# Patient Record
Sex: Male | Born: 1980 | Race: Black or African American | Hispanic: No | Marital: Married | Smoking: Never smoker
Health system: Southern US, Community
[De-identification: ages and names within clinical notes are randomized; demographics above are authoritative.]

## PROBLEM LIST (undated history)

## (undated) DIAGNOSIS — R739 Hyperglycemia, unspecified: Secondary | ICD-10-CM

## (undated) HISTORY — DX: Hyperglycemia, unspecified: R73.9

---

## 2016-10-30 ENCOUNTER — Encounter (HOSPITAL_COMMUNITY): Payer: Self-pay

## 2016-10-30 ENCOUNTER — Emergency Department (HOSPITAL_COMMUNITY)
Admission: EM | Admit: 2016-10-30 | Discharge: 2016-10-30 | Disposition: A | Discharge status: Home / Self Care | Payer: Self-pay | Attending: Emergency Medicine | Admitting: Emergency Medicine

## 2016-10-30 DIAGNOSIS — R69 Illness, unspecified: Secondary | ICD-10-CM

## 2016-10-30 DIAGNOSIS — J111 Influenza due to unidentified influenza virus with other respiratory manifestations: Secondary | ICD-10-CM | POA: Insufficient documentation

## 2016-10-30 MED ORDER — BENZONATATE 200 MG PO CAPS: 200.0000 mg | ORAL_CAPSULE | Freq: Three times a day (TID) | ORAL | 0 refills | Status: AC | PRN

## 2016-10-30 NOTE — ED Provider Notes (Signed)
[facility]EMER[facility]Y DEPT Provider Note   CSN: [id number] Arrival date & time: 10/30/16  1820  By signing my name below, I, Gregory Guerra, attest that this documentation has been prepared under the direction and in the presence of Gregory Buffalo, NP. Electronically Signed: Talbert Guerra, Scribe. 10/30/16. 6:45 PM.   History   Chief Complaint Chief Complaint  Patient presents with  . Generalized Body Aches    HPI Gregory Guerra is a 36 y.o. male who presents to the Emergency Department complaining of gradual onset, moderate, persistent generalized body aches that started 4 days ago. Pt has associated cough producing green/yellow phelgm, fever TMAX 103, sore throat, head ache, abdominal pain, ear pain. Pt has takes ibuprofen with limited relief. Pt report negative sick contact. Pt denies nausea, vomiting. Pt is able to keep fluids gone and appetite has not decreased.    The history is provided by the patient. No language interpreter was used.    History reviewed. No pertinent past medical history.  There are no active problems to display for this patient.   History reviewed. No pertinent surgical history.     Home Medications    Prior to Admission medications   Medication Sig Start Date End Date Taking? Authorizing Provider  benzonatate (TESSALON) 200 MG capsule Take 1 capsule (200 mg total) by mouth 3 (three) times daily as needed for cough. 10/30/16   Gregory Orlene Och, NP    Family History No family history on file.  Social History Social History  Substance Use Topics  . Smoking status: Never Smoker  . Smokeless tobacco: Never Used  . Alcohol use Not on file     Allergies   Patient has no known allergies.   Review of Systems Review of Systems  Constitutional: Positive for fever.  HENT: Positive for congestion, ear pain and sore throat.   Respiratory: Positive for cough.   Gastrointestinal: Positive for abdominal pain. Negative for nausea and vomiting.  Musculoskeletal: Positive  for arthralgias and myalgias.  Neurological: Negative for light-headedness.     Physical Exam Updated Vital Signs BP 124/73 (BP Location: Left Arm)   Pulse 78   Temp 99.6 F (37.6 C) (Oral)   Resp 20   Ht 5\' 6"  (1.676 m)   Wt 122.5 kg   SpO2 97%   BMI 43.58 kg/m   Physical Exam  Constitutional: He is oriented to person, place, and time. He appears well-developed and well-nourished. No distress.  HENT:  Head: Normocephalic and atraumatic.  Nose: Rhinorrhea present.  Mouth/Throat: Uvula is midline and mucous membranes are normal. No posterior oropharyngeal edema or posterior oropharyngeal erythema.  Left TM is normal. Right Tm is dull, retracted. No TM perforation, no erythema.  Eyes: Conjunctivae and EOM are normal. Pupils are equal, round, and reactive to light.  Pt wears contacts. Sclera is slightly red.  Neck: Normal range of motion. Neck supple. No tracheal deviation present.  No meningeal signs  Cardiovascular: Normal rate and regular rhythm.   Pulmonary/Chest: Effort normal. No respiratory distress. He has no wheezes. He has no rales. He exhibits no tenderness.  Abdominal: Soft. Bowel sounds are normal. There is no tenderness.  Musculoskeletal: Normal range of motion.  Lymphadenopathy:    He has no cervical adenopathy.  Neurological: He is alert and oriented to person, place, and time.  Skin: Skin is warm and dry.  Psychiatric: He has a normal mood and affect.  Nursing note and vitals reviewed.    ED Treatments / Results   DIAGNOSTIC  STUDIES: Oxygen Saturation is 99% on room air, normal by my interpretation.    COORDINATION OF CARE: 6:43 PM Discussed treatment plan with pt at bedside and pt agreed to plan, which includes not being able to start Tamaflu, but continual use ibuprofen, use of a humidifier, bed rest. If he gets worse or starts to decline, he has been told to come back.  Radiology No results found.  Procedures Procedures (including critical care  time)  Medications Ordered in ED Medications - No data to display   Initial Impression / Assessment and Plan / ED Course  I have reviewed the triage vital signs and the nursing notes.   Final Clinical Impressions(s) / ED Diagnoses  SUBJECTIVE:  Gregory Guerra is a 36 y.o. male who present complaining of flu-like symptoms: fevers, chills, myalgias, congestion, sore throat and cough for 4 days. Denies dyspnea or wheezing.  OBJECTIVE: Appears moderately ill but not toxic; temperature as noted in vitals. Throat and pharynx normal.  Neck supple. No adenopathy in the neck. Sinuses non tender. The chest is clear.  ASSESSMENT: Influenza like symptoms  PLAN: Symptomatic therapy suggested: rest, increase fluids, gargle prn for sore throat, use mist of vaporizer prn. Tylenol and ibuprofen for body aches and fever, cough medication as directed and return prn if symptoms persist or worsen.  Final diagnoses:  Influenza-like illness    New Prescriptions Discharge Medication List as of 10/30/2016  6:49 PM    START taking these medications   Details  benzonatate (TESSALON) 200 MG capsule Take 1 capsule (200 mg total) by mouth 3 (three) times daily as needed for cough., Starting Sat 10/30/2016, Print       I personally performed the services described in this documentation, which was scribed in my presence. The recorded information has been reviewed and is accurate.     Gregory M Neese, NP 10/30/16 1924    Arby BarretteMarcy Pfeiffer, MD 11/03/16 [phone]

## 2016-10-30 NOTE — ED Triage Notes (Signed)
PT reports he did not take the flu shot this year

## 2016-10-30 NOTE — ED Notes (Signed)
Declined W/C at D/C and was escorted to lobby by RN. 

## 2016-10-30 NOTE — ED Triage Notes (Signed)
Patient complains of cough, congestion, chills and body aches x 4 days, NAD. Alert and oriented

## 2016-10-30 NOTE — Discharge Instructions (Signed)
Take the cough pills as directed, take tylenol and ibuprofen as needed for fever and body aches. Use Chloraseptic spray and lozenges for your sore throat. Return if symptoms worsen.

## 2017-03-09 ENCOUNTER — Emergency Department (HOSPITAL_COMMUNITY)
Admission: EM | Admit: 2017-03-09 | Discharge: 2017-03-09 | Disposition: A | Discharge status: Home / Self Care | Payer: No Typology Code available for payment source | Attending: Emergency Medicine | Admitting: Emergency Medicine

## 2017-03-09 ENCOUNTER — Emergency Department (HOSPITAL_COMMUNITY): Payer: No Typology Code available for payment source

## 2017-03-09 DIAGNOSIS — S161XXA Strain of muscle, fascia and tendon at neck level, initial encounter: Secondary | ICD-10-CM | POA: Insufficient documentation

## 2017-03-09 DIAGNOSIS — Y92410 Unspecified street and highway as the place of occurrence of the external cause: Secondary | ICD-10-CM | POA: Diagnosis not present

## 2017-03-09 DIAGNOSIS — S0003XA Contusion of scalp, initial encounter: Secondary | ICD-10-CM | POA: Diagnosis not present

## 2017-03-09 DIAGNOSIS — Y93I9 Activity, other involving external motion: Secondary | ICD-10-CM | POA: Insufficient documentation

## 2017-03-09 DIAGNOSIS — S0990XA Unspecified injury of head, initial encounter: Secondary | ICD-10-CM | POA: Diagnosis present

## 2017-03-09 DIAGNOSIS — Y999 Unspecified external cause status: Secondary | ICD-10-CM | POA: Insufficient documentation

## 2017-03-09 MED ORDER — HYDROCODONE-ACETAMINOPHEN 5-325 MG PO TABS
1.0000 | ORAL_TABLET | Freq: Once | ORAL | Status: AC
  Administered 2017-03-09: 1 via ORAL
  Filled 2017-03-09: qty 1

## 2017-03-09 MED ORDER — DICLOFENAC SODIUM 50 MG PO TBEC: 50.0000 mg | DELAYED_RELEASE_TABLET | Freq: Two times a day (BID) | ORAL | 0 refills | Status: AC

## 2017-03-09 MED ORDER — CYCLOBENZAPRINE HCL 10 MG PO TABS
10.0000 mg | ORAL_TABLET | Freq: Once | ORAL | Status: AC
  Administered 2017-03-09: 10 mg via ORAL
  Filled 2017-03-09: qty 1

## 2017-03-09 MED ORDER — IBUPROFEN 200 MG PO TABS
600.0000 mg | ORAL_TABLET | Freq: Once | ORAL | Status: AC | PRN
  Administered 2017-03-09: 600 mg via ORAL

## 2017-03-09 MED ORDER — CYCLOBENZAPRINE HCL 10 MG PO TABS: 10.0000 mg | ORAL_TABLET | Freq: Two times a day (BID) | ORAL | 0 refills | Status: AC | PRN
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
## 2017-03-09 NOTE — ED Notes (Signed)
Pt is ambulatory, no acute distress.
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
## 2019-08-23 ENCOUNTER — Emergency Department (HOSPITAL_COMMUNITY): Payer: BC Managed Care – PPO

## 2019-08-23 ENCOUNTER — Other Ambulatory Visit: Payer: Self-pay

## 2019-08-23 ENCOUNTER — Emergency Department (HOSPITAL_COMMUNITY)
Admission: EM | Admit: 2019-08-23 | Discharge: 2019-08-23 | Disposition: A | Discharge status: Home / Self Care | Payer: BC Managed Care – PPO | Attending: Emergency Medicine | Admitting: Emergency Medicine

## 2019-08-23 DIAGNOSIS — R0602 Shortness of breath: Secondary | ICD-10-CM | POA: Diagnosis not present

## 2019-08-23 DIAGNOSIS — Z20828 Contact with and (suspected) exposure to other viral communicable diseases: Secondary | ICD-10-CM | POA: Insufficient documentation

## 2019-08-23 DIAGNOSIS — R072 Precordial pain: Secondary | ICD-10-CM | POA: Diagnosis not present

## 2019-08-23 DIAGNOSIS — L729 Follicular cyst of the skin and subcutaneous tissue, unspecified: Secondary | ICD-10-CM | POA: Diagnosis not present

## 2019-08-23 DIAGNOSIS — R0789 Other chest pain: Secondary | ICD-10-CM | POA: Diagnosis present

## 2019-08-23 LAB — BASIC METABOLIC PANEL WITH GFR
Anion gap: 11 (ref 5–15)
BUN: 12 mg/dL (ref 6–20)
CO2: 27 mmol/L (ref 22–32)
Calcium: 9.8 mg/dL (ref 8.9–10.3)
Chloride: 101 mmol/L (ref 98–111)
Creatinine, Ser: 1.08 mg/dL (ref 0.61–1.24)
GFR calc Af Amer: >60 mL/min
GFR calc non Af Amer: >60 mL/min
Glucose, Bld: 96 mg/dL (ref 70–99)
Potassium: 4.2 mmol/L (ref 3.5–5.1)
Sodium: 139 mmol/L (ref 135–145)

## 2019-08-23 LAB — CBC
HCT: 44.7 % (ref 39.0–52.0)
Hemoglobin: 14.2 g/dL (ref 13.0–17.0)
MCH: 26.7 pg (ref 26.0–34.0)
MCHC: 31.8 g/dL (ref 30.0–36.0)
MCV: 84 fL (ref 80.0–100.0)
Platelets: 426 K/uL — ABNORMAL HIGH (ref 150–400)
RBC: 5.32 MIL/uL (ref 4.22–5.81)
RDW: 14.5 % (ref 11.5–15.5)
WBC: 9.1 K/uL (ref 4.0–10.5)
nRBC: 0 % (ref 0.0–0.2)

## 2019-08-23 LAB — LIPASE, BLOOD: Lipase: 24 U/L (ref 11–51)

## 2019-08-23 LAB — HEPATIC FUNCTION PANEL
ALT: 24 U/L (ref 0–44)
AST: 24 U/L (ref 15–41)
Albumin: 4.3 g/dL (ref 3.5–5.0)
Alkaline Phosphatase: 69 U/L (ref 38–126)
Bilirubin, Direct: <0.1 mg/dL (ref 0.0–0.2)
Total Bilirubin: 0.8 mg/dL (ref 0.3–1.2)
Total Protein: 7.7 g/dL (ref 6.5–8.1)

## 2019-08-23 LAB — D-DIMER, QUANTITATIVE (NOT AT ARMC): D-Dimer, Quant: 0.36 ug/mL-FEU (ref 0.00–0.50)

## 2019-08-23 LAB — POC SARS CORONAVIRUS 2 AG -  ED: SARS Coronavirus 2 Ag: NEGATIVE

## 2019-08-23 LAB — TROPONIN I (HIGH SENSITIVITY): Troponin I (High Sensitivity): 5 ng/L (ref <18)
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
## 2019-08-23 NOTE — ED Notes (Signed)
"  Patient verbalizes understanding of discharge instructions. Opportunity for questioning and answers were provided.  pt discharged from ED ."  

## 2019-08-23 NOTE — ED Provider Notes (Signed)
[facility] EMERGENCY DEPARTMENT Provider Note   CSN: [id number] Arrival date & time: 08/23/19  1656     History Chief Complaint  Patient presents with  . Chest Pain    Mansel Strother is a 38 y.o. male.  The history is provided by the patient and medical records. No language interpreter was used.  Chest Pain Pain location:  Substernal area Pain quality: pressure   Pain radiates to:  Does not radiate Pain severity:  Moderate Onset quality:  Sudden Timing:  Constant Progression:  Improving Chronicity:  New Relieved by:  Nothing Worsened by:  Deep breathing Ineffective treatments:  None tried Associated symptoms: shortness of breath   Associated symptoms: no abdominal pain, no back pain, no cough, no diaphoresis, no dizziness, no fatigue, no fever, no headache, no lower extremity edema, no nausea, no palpitations, no vomiting and no weakness   Risk factors: male sex   Risk factors: no coronary artery disease        No past medical history on file.  There are no problems to display for this patient.   No past surgical history on file.     No family history on file.  Social History   Tobacco Use  . Smoking status: Never Smoker  . Smokeless tobacco: Never Used  Substance Use Topics  . Alcohol use: Not on file  . Drug use: Not on file    Home Medications Prior to Admission medications   Medication Sig Start Date End Date Taking? Authorizing Provider  benzonatate (TESSALON) 200 MG capsule Take 1 capsule (200 mg total) by mouth 3 (three) times daily as needed for cough. 10/30/16   Janne Napoleon, NP  cyclobenzaprine (FLEXERIL) 10 MG tablet Take 1 tablet (10 mg total) by mouth 2 (two) times daily as needed for muscle spasms. 03/09/17   Janne Napoleon, NP  diclofenac (VOLTAREN) 50 MG EC tablet Take 1 tablet (50 mg total) by mouth 2 (two) times daily. 03/09/17   Janne Napoleon, NP    Allergies    Patient has no known allergies.  Review of Systems     Review of Systems  Constitutional: Negative for chills, diaphoresis, fatigue and fever.  HENT: Negative for congestion.   Respiratory: Positive for shortness of breath. Negative for cough, chest tightness, wheezing and stridor.   Cardiovascular: Positive for chest pain. Negative for palpitations.  Gastrointestinal: Negative for abdominal pain, constipation, diarrhea, nausea and vomiting.  Genitourinary: Negative for flank pain.  Musculoskeletal: Negative for back pain, neck pain and neck stiffness.  Skin: Negative for rash and wound.  Neurological: Negative for dizziness, weakness, light-headedness and headaches.  Psychiatric/Behavioral: Negative for agitation and confusion.  All other systems reviewed and are negative.   Physical Exam Updated Vital Signs BP 131/87 (BP Location: Right Arm)   Pulse 76   Temp 98 F (36.7 C) (Oral)   Resp 16   SpO2 98%   Physical Exam Vitals and nursing note reviewed.  Constitutional:      General: He is not in acute distress.    Appearance: He is well-developed. He is not ill-appearing, toxic-appearing or diaphoretic.  HENT:     Head: Normocephalic and atraumatic.  Eyes:     Extraocular Movements: Extraocular movements intact.     Conjunctiva/sclera: Conjunctivae normal.     Pupils: Pupils are equal, round, and reactive to light.  Cardiovascular:     Rate and Rhythm: Normal rate and regular rhythm.     Heart sounds:  Normal heart sounds. No murmur.  Pulmonary:     Effort: Pulmonary effort is normal. No respiratory distress.     Breath sounds: Normal breath sounds. No decreased breath sounds, wheezing, rhonchi or rales.  Abdominal:     Palpations: Abdomen is soft.     Tenderness: There is no abdominal tenderness.  Musculoskeletal:     Cervical back: Neck supple.     Right lower leg: No tenderness. No edema.     Left lower leg: No tenderness. No edema.  Skin:    General: Skin is warm and dry.     Capillary Refill: Capillary refill takes  less than 2 seconds.     Findings: No erythema.  Neurological:     General: No focal deficit present.     Mental Status: He is alert.     ED Results / Procedures / Treatments   Labs (all labs ordered are listed, but only abnormal results are displayed) Labs Reviewed  CBC - Abnormal; Notable for the following components:      Result Value   Platelets 426 (*)    All other components within normal limits  NOVEL CORONAVIRUS, NAA (HOSP ORDER, SEND-OUT TO REF LAB; TAT 18-24 HRS)  BASIC METABOLIC PANEL  HEPATIC FUNCTION PANEL  LIPASE, BLOOD  D-DIMER, QUANTITATIVE (NOT [facility])  POC SARS CORONAVIRUS 2 AG -  ED  TROPONIN I (HIGH SENSITIVITY)  TROPONIN I (HIGH SENSITIVITY)    EKG EKG Interpretation  Date/Time:  Thursday August 23 2019 16:59:55 EST Ventricular Rate:  73 PR Interval:  132 QRS Duration: 90 QT Interval:  364 QTC Calculation: 401 R Axis:   76 Text Interpretation: Normal sinus rhythm Possible Inferior infarct , age undetermined Abnormal ECG S1Q3T3. No prior ECG for comparison. No STEMI Confirmed by Theda Belfastegeler, Chris ([id number]) on 08/23/2019 6:12:37 PM   Radiology DG Chest 2 View  Result Date: 08/23/2019 CLINICAL DATA:  Chest tightness and pressure. EXAM: CHEST - 2 VIEW COMPARISON:  None. FINDINGS: The heart size and mediastinal contours are within normal limits. Both lungs are clear. The visualized skeletal structures are unremarkable. IMPRESSION: No active cardiopulmonary disease. Electronically Signed   By: Sherian ReinWei-Chen  Lin M.D.   On: 08/23/2019 17:59   US SCROTUM W/DOPPLER  Result Date: 08/23/2019 CLINICAL DATA:  Left scrotal pain EXAM: SCROTAL ULTRASOUND DOPPLER ULTRASOUND OF THE TESTICLES TECHNIQUE: Complete ultrasound examination of the testicles, epididymis, and other scrotal structures was performed. Color and spectral Doppler ultrasound were also utilized to evaluate blood flow to the testicles. COMPARISON:  None. FINDINGS: Right testicle Measurements: 3.8 x 2.2 x  3.7 cm. No mass or microlithiasis visualized. Left testicle Measurements: 3.4 x 2.1 x 3.2 cm. There is an anechoic thin walled cystic structure not clearly in continuity with either the testicle or the epididymis. Appears closely associated with the scrotal wall, measuring 2.4 x 4.1 x 2.1 cm Right epididymis:  Normal in size and appearance. Left epididymis:  Normal in size and appearance. Hydrocele:  None visualized. Varicocele:  None visualized. Pulsed Doppler interrogation of both testes demonstrates normal low resistance arterial and venous waveforms bilaterally. IMPRESSION: Area of discomfort likely corresponds to a simple appearing cystic paratesticular cystic lesion without clear connection to the epididymis. Overall, appearance favors a benign etiology such as a scrotal tunica cyst without aggressive soft tissue features. Electronically Signed   By: Kreg ShropshirePrice  DeHay M.D.   On: 08/23/2019 21:12    Procedures Procedures (including critical care time)  Medications Ordered in ED Medications - No data  to display  ED Course  I have reviewed the triage vital signs and the nursing notes.  Pertinent labs & imaging results that were available during my care of the patient were reviewed by me and considered in my medical decision making (see chart for details).    MDM Rules/Calculators/A&P                        Franco Duley is a 38 y.o. male   with no significant past medical history who presents with chest pain shortness of breath since yesterday.  He reports that yesterday while driving his car, he had sudden onset of chest pain shortness of breath.  It was like a pressure and tightness going around his chest from the front.  He reports it is worse with deep breathing.  He denies recent cough or congestion.  No recent fevers or chills.  He denies any nausea vomiting, urinary symptoms or GI symptoms.  No diaphoresis.  It does not radiate down his arms.  He denies any leg pain or leg swelling.  He does  report chronic left scrotal aching and discomfort but denies any swelling.  No urinary symptoms.  No reported hematuria.  No other complaints on arrival.    On exam.  Lungs are clear and chest is nontender.  Abdomen is nontender.  Good pulses in upper and lower extremities.  No lower extremity tenderness appreciated.  Abdomen is nontender.  Patient resting comfortably on room air.  EKG shows no STEMI but does show a S1Q3T3 pattern.  Will get D-dimer to help rule out PE as etiology of chest discomfort and shortness of breath.  Patient will have labs and chest x-ray.  Anticipate reassessment for work-up.  We will assess the patient's scrotum with a chaperone to determine if we feel he needs ultrasound.    8:00 PM Chaperone was present for a scrotal exam.  Patient does not have evidence of a hernia but did have some tenderness on the left hemiscrotum.  No true testicle tenderness.  Given the tenderness, will get ultrasound to rule out abnormality.  Ultrasound of the scrotum did not show torsion or hernia.  Patient did have a small cyst which he can follow-up with urology for.  Patient's chest pain and shortness of breath improved in the emergency department and his labs were overall reassuring.  His D-dimer was negative, doubt PE.  Initial troponin negative.  We discussed a second troponin however given his pain being ongoing since yesterday, he agreed for single troponin.  Patient otherwise felt better and his labs were reassuring.  Rapid Covid was negative but will get PCR sent.  Patient will be discharged to follow with PCP and will follow up on the Covid result.  Patient discharged in good condition with improved symptoms.   Final Clinical Impression(s) / ED Diagnoses Final diagnoses:  Precordial pain  Shortness of breath  Scrotal cyst    Rx / DC Orders ED Discharge Orders    None      Clinical Impression: 1. Precordial pain   2. Shortness of breath   3. Scrotal cyst      Disposition: Discharge  Condition: Good  I have discussed the results, Dx and Tx plan with the pt(& family if present). He/she/they expressed understanding and agree(s) with the plan. Discharge instructions discussed at great length. Strict return precautions discussed and pt &/or family have verbalized understanding of the instructions. No further questions at time of discharge.  New Prescriptions   No medications on file    Follow Up: [facility] [address] [place] [address] [phone]    [facility] EMERGENCY DEPARTMENT [address] [id number] mc [place] [address] [phone]    [facility] [address] [place] [address] [phone] Schedule an appointment as soon as possible for a visit       Daxton Nydam, Canary Brim, MD 08/23/19 [phone]

## 2019-08-23 NOTE — Discharge Instructions (Signed)
Your work-up today was overall reassuring.  Your cardiac enzyme was negative on a single test and as we discussed together, based on your symptoms being present since yesterday, we agreed to not get a second at this time.  Your test to rule out blood clot was negative.  Your chest x-ray was reassuring.  Your scrotal ultrasound did not show torsion or infection but did show a small cyst.  Please follow-up with urology for further management of this.  Please rest and stay hydrated.  Your second Covid test should result tomorrow, please follow on my chart for this result.  You will also be called if it is positive.  Please rest.  If any symptoms change or worsen, please return to nearest emergency department.

## 2019-08-25 LAB — NOVEL CORONAVIRUS, NAA (HOSP ORDER, SEND-OUT TO REF LAB; TAT 18-24 HRS): SARS-CoV-2, NAA: NOT DETECTED

## 2019-08-30 ENCOUNTER — Ambulatory Visit: Payer: BC Managed Care – PPO | Admitting: Cardiovascular Disease

## 2019-08-30 NOTE — Progress Notes (Deleted)
Cardiology Office Note   Date:  08/30/2019   ID:  Gregory Guerra, DOB July 05, 1981, MRN [id number]  PCP:  Patient, No Pcp Per  Cardiologist:   Chilton Si, MD   No chief complaint on file.     History of Present Illness: Gregory Guerra is a 38 y.o. male who presents for an evaluation of chest pain and shortness of breath.    He was seen in the ED where EKG was concerning for PE.  He had a negative d-dimer and cardiac enzymes were negative.  He was instructed to follow up as an outpatient.  COVID was negative.   No past medical history on file.  No past surgical history on file.   Current Outpatient Medications  Medication Sig Dispense Refill  . benzonatate (TESSALON) 200 MG capsule Take 1 capsule (200 mg total) by mouth 3 (three) times daily as needed for cough. (Patient not taking: Reported on 08/23/2019) 20 capsule 0  . cyclobenzaprine (FLEXERIL) 10 MG tablet Take 1 tablet (10 mg total) by mouth 2 (two) times daily as needed for muscle spasms. (Patient not taking: Reported on 08/23/2019) 20 tablet 0  . diclofenac (VOLTAREN) 50 MG EC tablet Take 1 tablet (50 mg total) by mouth 2 (two) times daily. (Patient not taking: Reported on 08/23/2019) 15 tablet 0   No current facility-administered medications for this visit.    Allergies:   Apple and Cherry    Social History:  The patient  reports that he has never smoked. He has never used smokeless tobacco.   Family History:  The patient's ***family history is not on file.    ROS:  Please see the history of present illness.   Otherwise, review of systems are positive for {NONE DEFAULTED:18576::"none"}.   All other systems are reviewed and negative.    PHYSICAL EXAM: VS:  There were no vitals taken for this visit. , BMI There is no height or weight on file to calculate BMI. GENERAL:  Well appearing HEENT:  Pupils equal round and reactive, fundi not visualized, oral mucosa unremarkable NECK:  No jugular venous distention,  waveform within normal limits, carotid upstroke brisk and symmetric, no bruits, no thyromegaly LYMPHATICS:  No cervical adenopathy LUNGS:  Clear to auscultation bilaterally HEART:  RRR.  PMI not displaced or sustained,S1 and S2 within normal limits, no S3, no S4, no clicks, no rubs, *** murmurs ABD:  Flat, positive bowel sounds normal in frequency in pitch, no bruits, no rebound, no guarding, no midline pulsatile mass, no hepatomegaly, no splenomegaly EXT:  2 plus pulses throughout, no edema, no cyanosis no clubbing SKIN:  No rashes no nodules NEURO:  Cranial nerves II through XII grossly intact, motor grossly intact throughout PSYCH:  Cognitively intact, oriented to person place and time    EKG:  EKG {ACTION; IS/IS WUJ:81191478} ordered today. The ekg ordered today demonstrates ***   Recent Labs: 08/23/2019: ALT 24; BUN 12; Creatinine, Ser 1.08; Hemoglobin 14.2; Platelets 426; Potassium 4.2; Sodium 139    Lipid Panel No results found for: CHOL, TRIG, HDL, CHOLHDL, VLDL, LDLCALC, LDLDIRECT    Wt Readings from Last 3 Encounters:  10/30/16 270 lb (122.5 kg)      ASSESSMENT AND PLAN:  ***   Current medicines are reviewed at length with the patient today.  The patient {ACTIONS; HAS/DOES NOT HAVE:19233} concerns regarding medicines.  The following changes have been made:  {PLAN; NO CHANGE:13088:s}  Labs/ tests ordered today include: *** No orders of the defined  types were placed in this encounter.    Disposition:   FU with ***     Signed, Parthena Fergeson C. Oval Linsey, MD, [facility]  08/30/2019 1:19 PM    [place]

## 2019-11-06 ENCOUNTER — Other Ambulatory Visit: Payer: Self-pay

## 2019-11-06 ENCOUNTER — Encounter: Payer: Self-pay | Admitting: Cardiology

## 2019-11-06 ENCOUNTER — Ambulatory Visit: Payer: BC Managed Care – PPO | Admitting: Cardiology

## 2019-11-06 VITALS — BP 128/74 | HR 80 | Temp 96.7°F | Ht 66.0 in | Wt 304.0 lb

## 2019-11-06 DIAGNOSIS — R079 Chest pain, unspecified: Secondary | ICD-10-CM

## 2019-11-06 NOTE — Patient Instructions (Signed)
Medication Instructions:  No Changes *If you need a refill on your cardiac medications before your next appointment, please call your pharmacy*  Lab Work: None If you have labs (blood work) drawn today and your tests are completely normal, you will receive your results only by: Marland Kitchen MyChart Message (if you have MyChart) OR . A paper copy in the mail If you have any lab test that is abnormal or we need to change your treatment, we will call you to review the results.  Testing/Procedures: Your physician has requested that you have an exercise tolerance test. For further information please visit [email or web address] Please also follow instruction sheet, as given.   You will need a Covid Screening 3 days prior to your exercise tolerance test.This is a Drive Up Visit at the [facility] [address] [place]. Someone will direct you to the appropriate testing line. Stay in your car and someone will be with you shortly   Follow-Up: At [facility], you and your health needs are our priority.  As part of our continuing mission to provide you with exceptional heart care, we have created designated Provider Care Teams.  These Care Teams include your primary Cardiologist (physician) and Advanced Practice Providers (APPs -  Physician Assistants and Nurse Practitioners) who all work together to provide you with the care you need, when you need it.  Your next appointment:   Follow up as needed

## 2019-11-06 NOTE — Progress Notes (Signed)
Cardiology Office Note   Date:  11/06/2019   ID:  Gregory Guerra, DOB 27-Dec-1980, MRN [id number]  PCP:  Patient, No Pcp Per  Cardiologist:   No primary care provider on file. Referring:  Delma Officer, PA   Chief Complaint  Patient presents with  . Chest Pain      History of Present Illness: Gregory Guerra is a 39 y.o. male who is referred by Delma Officer, PA for evaluation of chest pain.    He was in the ED in December for chest pain.  I reviewed these records for this visit.    This pain was not thought to be cardiac.  EKG and troponin was negative.    He reports that in December he started noticing some discomfort when he would wake up in the morning. It would last for about 10 minutes. It was 4 out of 10 in intensity and would happen when he got up out of bed quickly. The episode in December when he presented to the emergency room was while he was driving. It was severe and heavy and lasted about 20 minutes. He thinks it was 9 out of 10 in intensity. He came in once spontaneously. There were no associated symptoms. The most recent episode of any discomfort was 2 days ago when he had two shots of liquor. He had some chest tightness. He was some mild associated shortness of breath. He has had some discomfort during intercourse. He has some mild discomfort perhaps jogging a couple of weeks ago. He has some mild dyspnea on exertion.  He has never had prior cardiac history however. He has just been told that his hemoglobin A1c was elevated but he otherwise has had no hypertension dyslipidemia diabetes or other cardiovascular risk factors.  Past Medical History:  Diagnosis Date  . Hyperglycemia     History reviewed. No pertinent surgical history.   Current Outpatient Medications  Medication Sig Dispense Refill  . benzonatate (TESSALON) 200 MG capsule Take 1 capsule (200 mg total) by mouth 3 (three) times daily as needed for cough. (Patient not taking: Reported on 11/06/2019) 20  capsule 0  . cyclobenzaprine (FLEXERIL) 10 MG tablet Take 1 tablet (10 mg total) by mouth 2 (two) times daily as needed for muscle spasms. (Patient not taking: Reported on 11/06/2019) 20 tablet 0  . diclofenac (VOLTAREN) 50 MG EC tablet Take 1 tablet (50 mg total) by mouth 2 (two) times daily. (Patient not taking: Reported on 11/06/2019) 15 tablet 0   No current facility-administered medications for this visit.    Allergies:   Apple and Cherry    Social History:  The patient  reports that he has never smoked. He has never used smokeless tobacco.  c Family History:  The patient's family history is not significant for CAD.     ROS:  Please see the history of present illness.   Otherwise, review of systems are positive for none.   All other systems are reviewed and negative.    PHYSICAL EXAM: VS:  BP 128/74   Pulse 80   Temp (!) 96.7 F (35.9 C)   Ht 5\' 6"  (1.676 m)   Wt (!) 304 lb (137.9 kg)   SpO2 94%   BMI 49.07 kg/m  , BMI Body mass index is 49.07 kg/m. GENERAL:  Well appearing HEENT:  Pupils equal round and reactive, fundi not visualized, oral mucosa unremarkable NECK:  No jugular venous distention, waveform within normal limits, carotid upstroke brisk  and symmetric, no bruits, no thyromegaly LYMPHATICS:  No cervical, inguinal adenopathy LUNGS:  Clear to auscultation bilaterally BACK:  No CVA tenderness CHEST:  Unremarkable HEART:  PMI not displaced or sustained,S1 and S2 within normal limits, no S3, no S4, no clicks, no rubs, no murmurs ABD:  Flat, positive bowel sounds normal in frequency in pitch, no bruits, no rebound, no guarding, no midline pulsatile mass, no hepatomegaly, no splenomegaly EXT:  2 plus pulses throughout, no edema, no cyanosis no clubbing SKIN:  No rashes no nodules NEURO:  Cranial nerves II through XII grossly intact, motor grossly intact throughout PSYCH:  Cognitively intact, oriented to person place and time    EKG:  EKG is ordered today. The ekg  ordered today demonstrates sinus rhythm, rate 41, axis within normal limits, intervals within normal limits, nonspecific anterior T wave flattening.   Recent Labs: 08/23/2019: ALT 24; BUN 12; Creatinine, Ser 1.08; Hemoglobin 14.2; Platelets 426; Potassium 4.2; Sodium 139    Lipid Panel No results found for: CHOL, TRIG, HDL, CHOLHDL, VLDL, LDLCALC, LDLDIRECT    Wt Readings from Last 3 Encounters:  11/06/19 (!) 304 lb (137.9 kg)  10/30/16 270 lb (122.5 kg)      Other studies Reviewed: Additional studies/ records that were reviewed today include: ED records. Review of the above records demonstrates:  Please see elsewhere in the note.     ASSESSMENT AND PLAN:  CHEST PAIN: The patient's chest pain is atypical. However, given his new onset hyperglycemia as well as his nonspecific EKG changes I will bring the patient back for a POET (Plain Old Exercise Test). This will allow me to screen for obstructive coronary disease, risk stratify and very importantly provide a prescription for exercise.  HYPERGLYCEMIA: His A1c was 6.5. We talked extensively about diet and exercise to manage this.-Start Mediterranean diet. I will start with specific exercise strategies. I will defer follow-up to [place], Minette Brine M, PA   DYSLIPIDEMIA: LDL is 116. HDL 41. I think dietary modification is indicated first.  MORBID OBESITY: Specifics and goals of therapy were discussed in detail.   Current medicines are reviewed at length with the patient today.  The patient does not have concerns regarding medicines.  The following changes have been made:  no change  Labs/ tests ordered today include:   Orders Placed This Encounter  Procedures  . EXERCISE TOLERANCE TEST (ETT)  . EKG 12-Lead     Disposition:   FU with me as needed.      Signed, Minus Breeding, MD  11/06/2019 5:17 PM    [place]

## 2019-11-08 ENCOUNTER — Telehealth (HOSPITAL_COMMUNITY): Payer: Self-pay

## 2019-11-08 NOTE — Telephone Encounter (Signed)
Encounter complete. 

## 2019-11-09 ENCOUNTER — Other Ambulatory Visit (HOSPITAL_COMMUNITY): Payer: BC Managed Care – PPO

## 2019-11-13 ENCOUNTER — Inpatient Hospital Stay (HOSPITAL_COMMUNITY): Admission: RE | Admit: 2019-11-13 | Payer: BC Managed Care – PPO | Source: Ambulatory Visit

## 2019-11-29 ENCOUNTER — Telehealth (HOSPITAL_COMMUNITY): Payer: Self-pay

## 2019-11-29 NOTE — Telephone Encounter (Signed)
Encounter complete. 

## 2019-11-30 ENCOUNTER — Other Ambulatory Visit (HOSPITAL_COMMUNITY): Payer: BC Managed Care – PPO

## 2019-12-04 ENCOUNTER — Inpatient Hospital Stay (HOSPITAL_COMMUNITY): Admission: RE | Admit: 2019-12-04 | Payer: BC Managed Care – PPO | Source: Ambulatory Visit

## 2019-12-05 ENCOUNTER — Telehealth (HOSPITAL_COMMUNITY): Payer: Self-pay | Admitting: Cardiology

## 2019-12-05 NOTE — Telephone Encounter (Signed)
Patient has No Showed COVID testing x 2 and cancelled GXT on several occasions.  I sent a IN BASKET message to Deliah Goody Rn and she states not to reschedule GXT. I will remove order from the WQ.  Thea Alken

## 2020-05-19 IMAGING — DX DG CHEST 2V
2 series · 2 of 2 positions shown · non-contrast
Comparison: None.

CLINICAL DATA: Chest tightness and pressure.

EXAM:
CHEST - 2 VIEW

[chest pa]
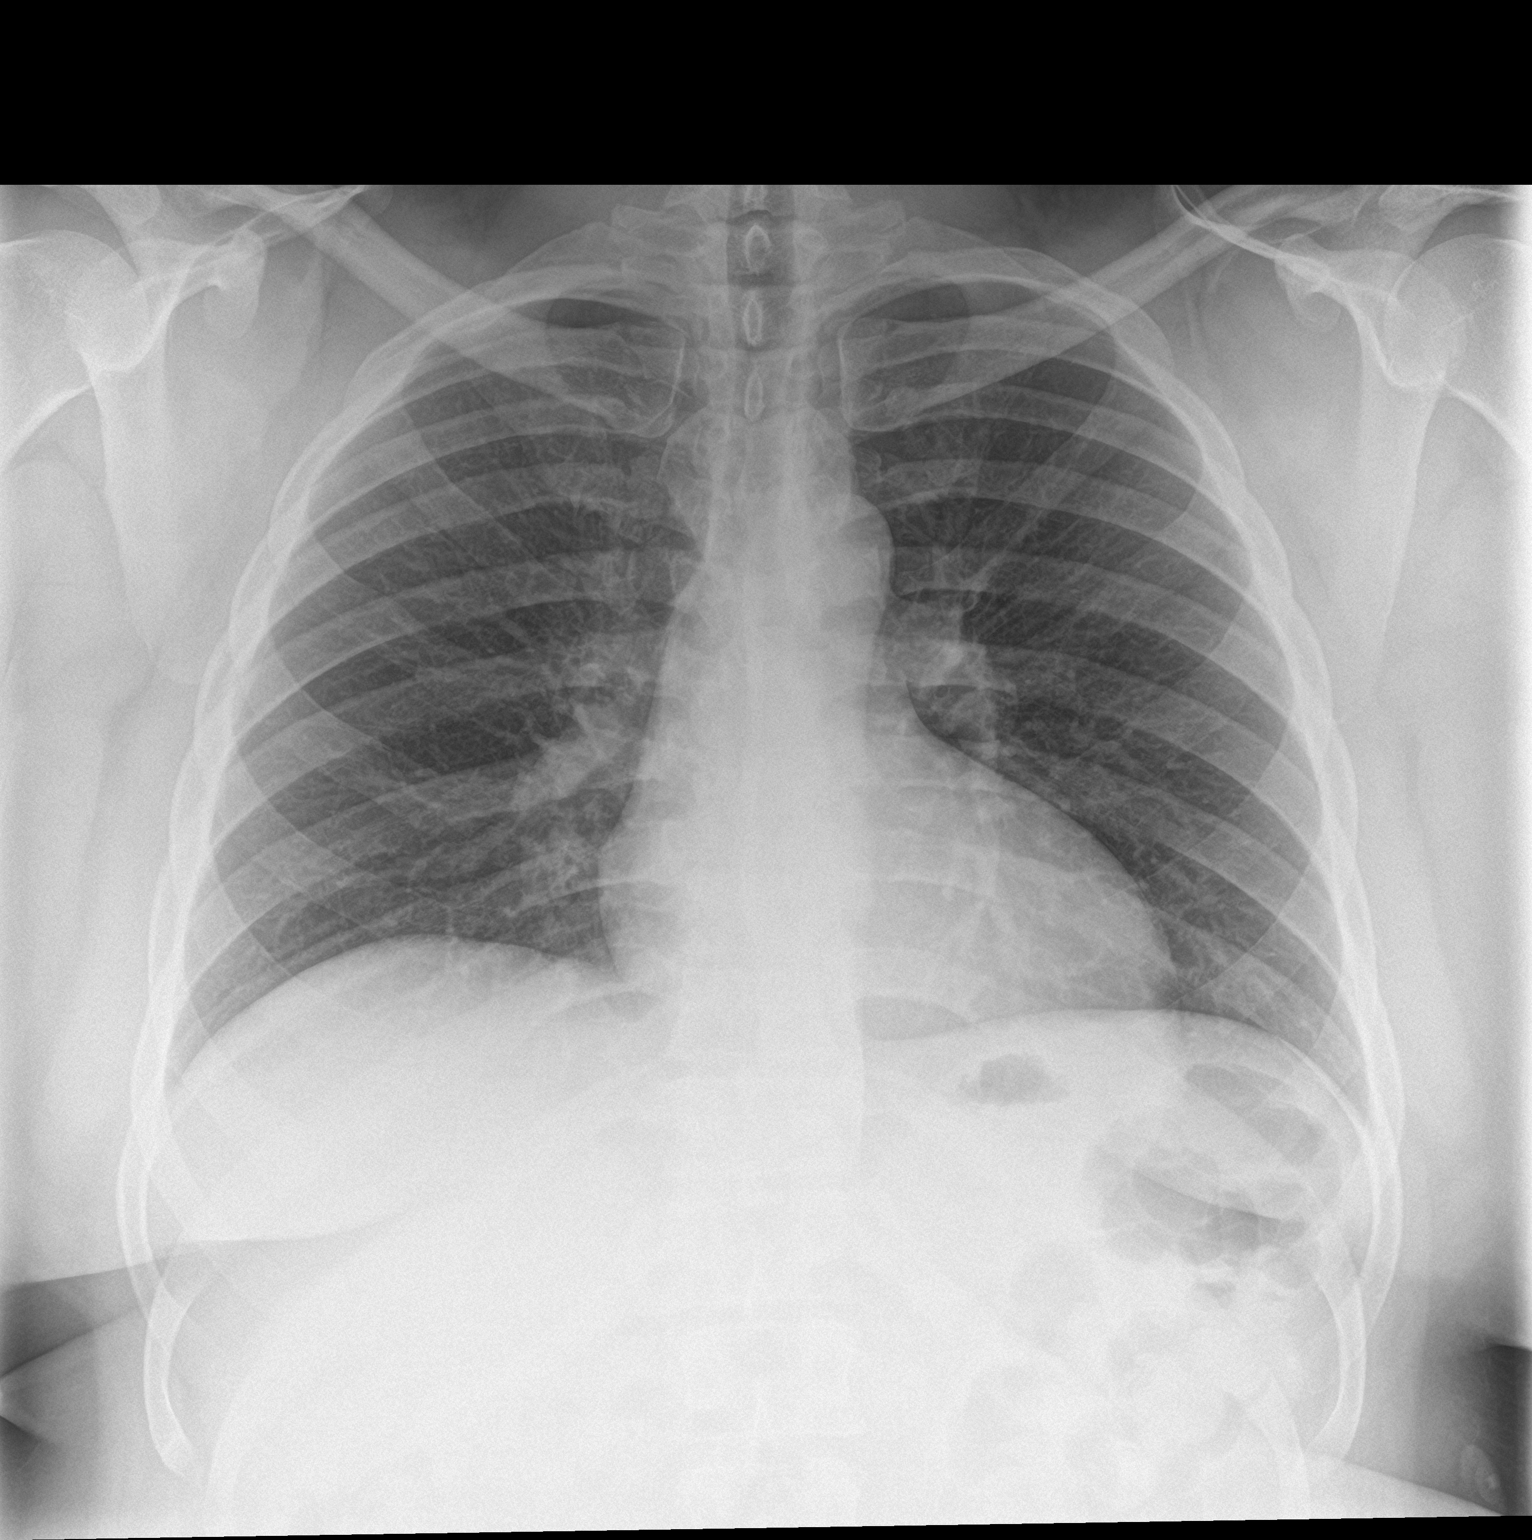

[chest lat]
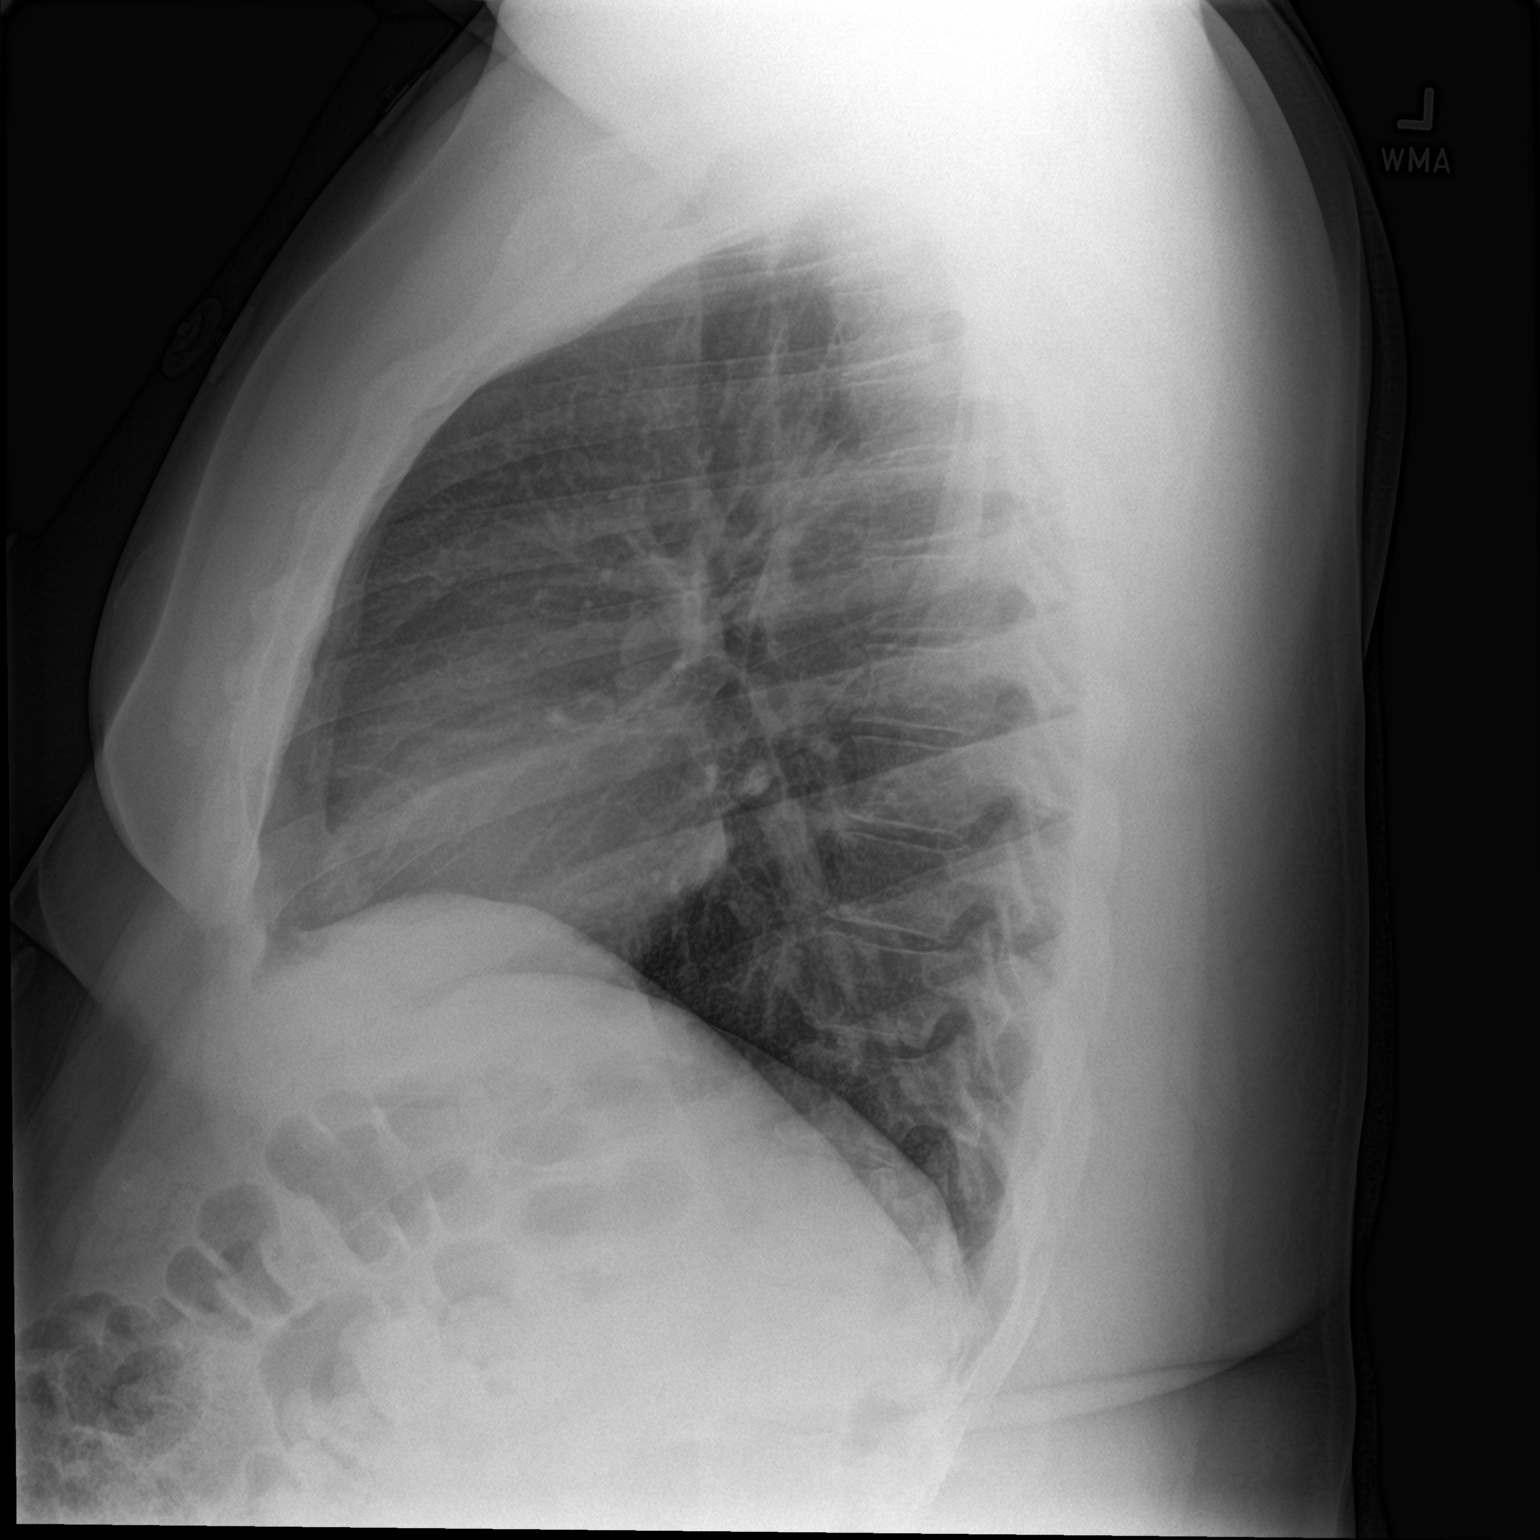

[2 of 2 positions shown; findings below may reference images not displayed]

FINDINGS: The heart size and mediastinal contours are within normal limits.
Both lungs are clear. The visualized skeletal structures are
unremarkable.
IMPRESSION: No active cardiopulmonary disease.

## 2020-05-19 IMAGING — US US SCROTUM W/ DOPPLER COMPLETE
1 series · 13 of 25 positions shown · non-contrast
Comparison: None.

CLINICAL DATA: Left scrotal pain

EXAM:
SCROTAL ULTRASOUND
DOPPLER ULTRASOUND OF THE TESTICLES
TECHNIQUE: Complete ultrasound examination of the testicles, epididymis, and
other scrotal structures was performed. Color and spectral Doppler
ultrasound were also utilized to evaluate blood flow to the
testicles.

[Series 1: us scrotum w/ doppler complete · 63 acquisitions, 13 frames shown]
[im 1/63]
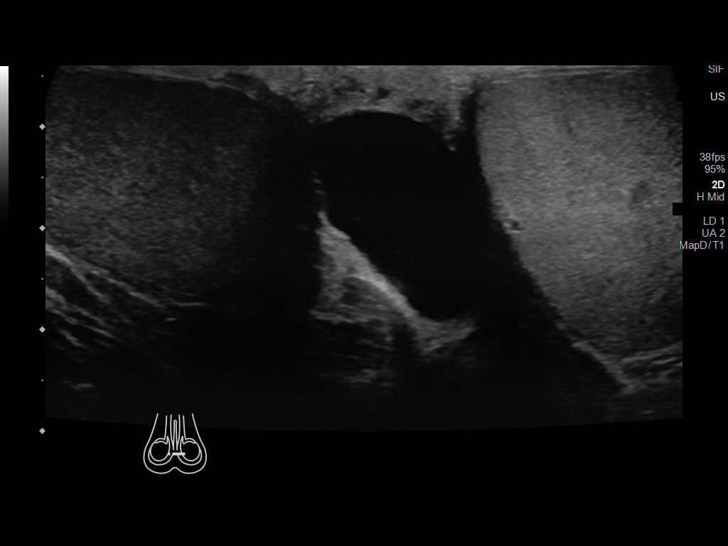
[im 6/63]
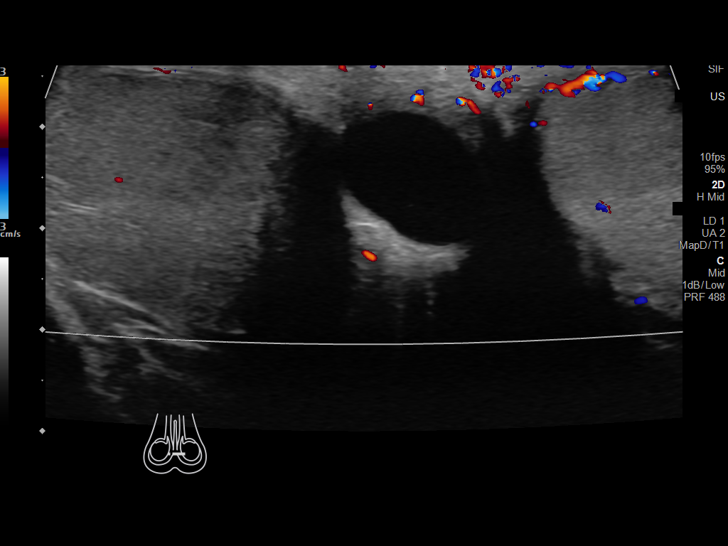
[im 11/63]
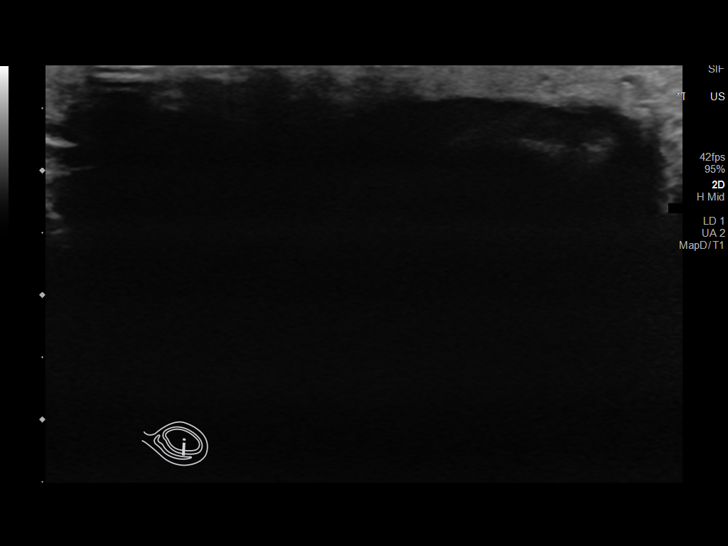
[im 16/63]
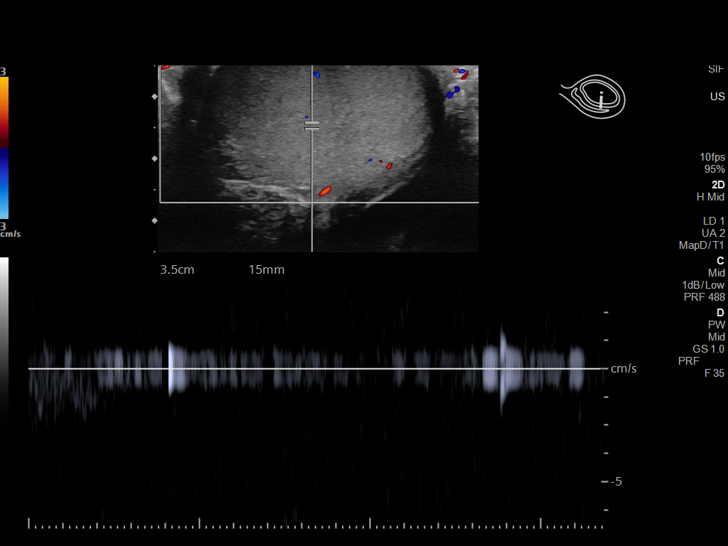
[im 21/63]
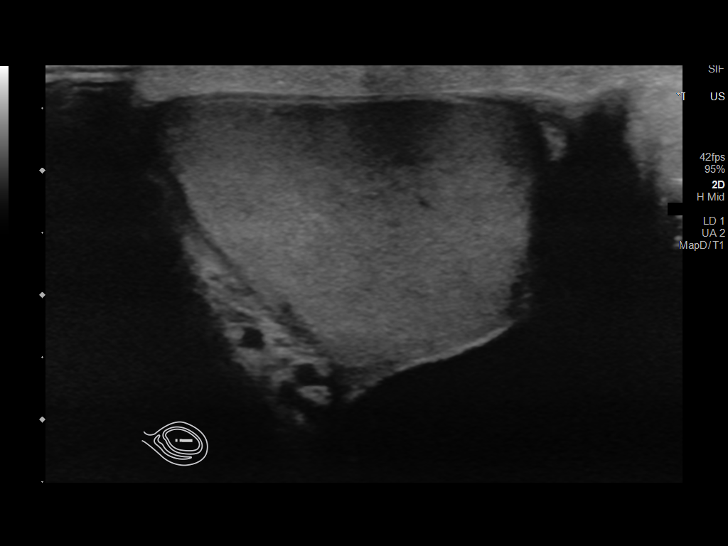
[im 26/63]
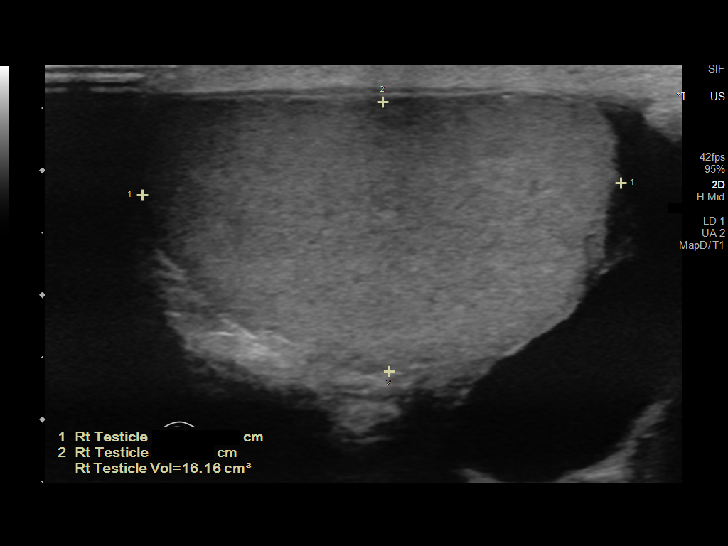
[im 32/63]
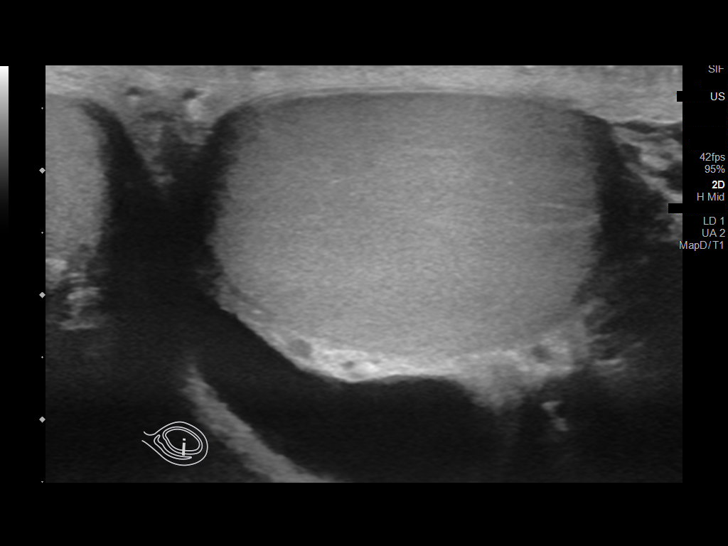
[im 37/63]
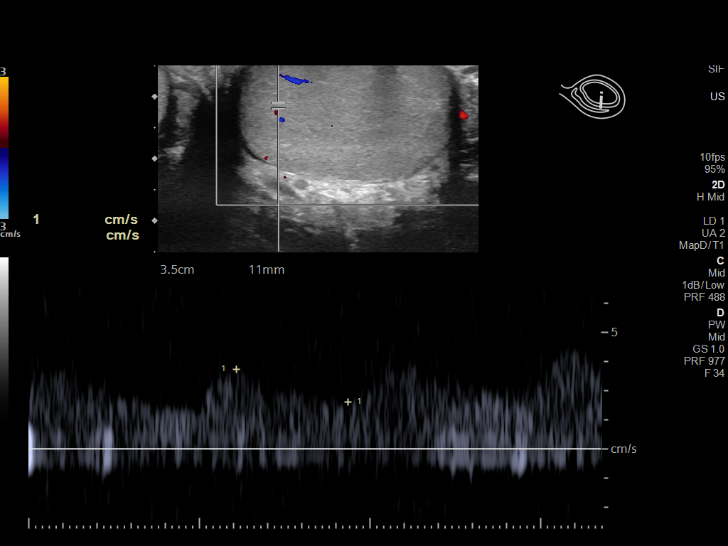
[im 42/63]
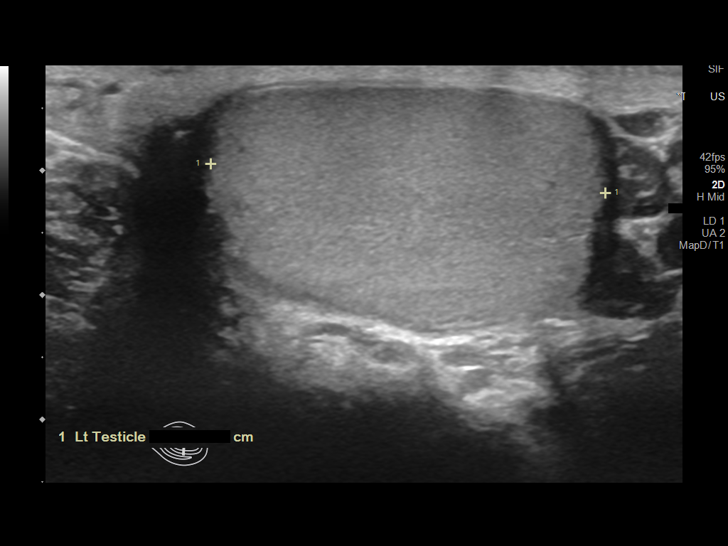
[im 47/63]
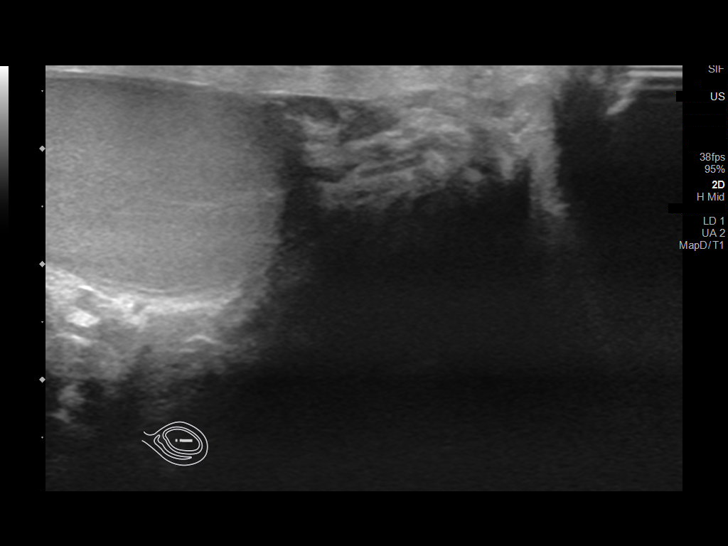
[im 52/63]
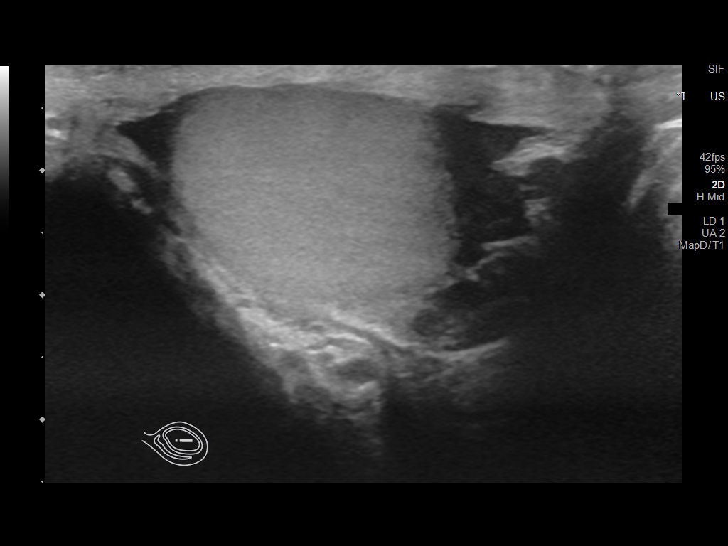
[im 57/63]
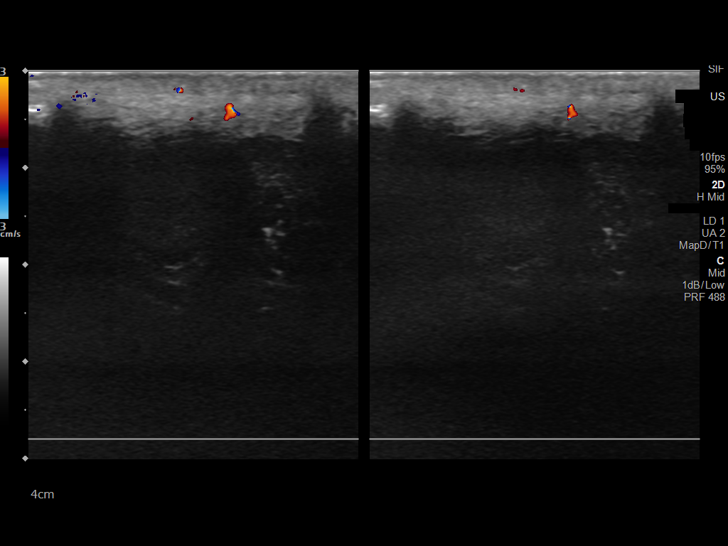
[im 63/63]
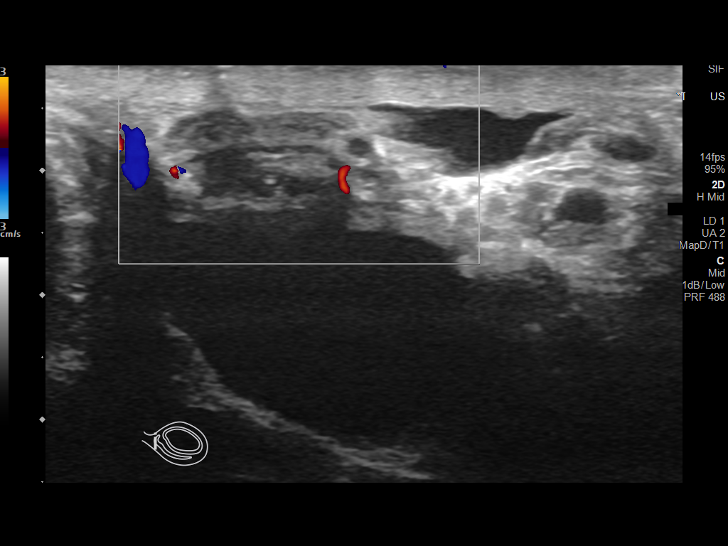

[13 of 25 positions shown; findings below may reference images not displayed]

FINDINGS: Right testicle

Measurements: 3.8 x 2.2 x 3.7 cm. No mass or microlithiasis
visualized.

Left testicle

Measurements: 3.4 x 2.1 x 3.2 cm. There is an anechoic thin walled
cystic structure not clearly in continuity with either the testicle
or the epididymis. Appears closely associated with the scrotal wall,
measuring 2.4 x 4.1 x 2.1 cm

Right epididymis:  Normal in size and appearance.

Left epididymis:  Normal in size and appearance.

Hydrocele:  None visualized.

Varicocele:  None visualized.

Pulsed Doppler interrogation of both testes demonstrates normal low
resistance arterial and venous waveforms bilaterally.
IMPRESSION: Area of discomfort likely corresponds to a simple appearing cystic
paratesticular cystic lesion without clear connection to the
epididymis. Overall, appearance favors a benign etiology such as a
scrotal tunica cyst without aggressive soft tissue features.
